# Patient Record
Sex: Female | Born: 1995 | Race: Black or African American | Hispanic: No | Marital: Single | Smoking: Never smoker
Health system: Southern US, Community
[De-identification: ages and names within clinical notes are randomized; demographics above are authoritative.]

---

## 2016-07-21 ENCOUNTER — Emergency Department (HOSPITAL_COMMUNITY)
Admission: EM | Admit: 2016-07-21 | Discharge: 2016-07-21 | Disposition: A | Discharge status: Home / Self Care | Payer: Medicaid Other | Attending: Emergency Medicine | Admitting: Emergency Medicine

## 2016-07-21 ENCOUNTER — Encounter (HOSPITAL_COMMUNITY): Payer: Self-pay | Admitting: Emergency Medicine

## 2016-07-21 ENCOUNTER — Emergency Department (HOSPITAL_COMMUNITY): Payer: Medicaid Other

## 2016-07-21 DIAGNOSIS — K5900 Constipation, unspecified: Secondary | ICD-10-CM | POA: Insufficient documentation

## 2016-07-21 DIAGNOSIS — R109 Unspecified abdominal pain: Secondary | ICD-10-CM

## 2016-07-21 LAB — COMPREHENSIVE METABOLIC PANEL
ALT: 17 U/L (ref 14–54)
AST: 21 U/L (ref 15–41)
Albumin: 4 g/dL (ref 3.5–5.0)
Alkaline Phosphatase: 124 U/L (ref 38–126)
Anion gap: 6 (ref 5–15)
BUN: 7 mg/dL (ref 6–20)
CHLORIDE: 108 mmol/L (ref 101–111)
CO2: 24 mmol/L (ref 22–32)
CREATININE: 0.51 mg/dL (ref 0.44–1.00)
Calcium: 9.2 mg/dL (ref 8.9–10.3)
Glucose, Bld: 88 mg/dL (ref 65–99)
Potassium: 3.7 mmol/L (ref 3.5–5.1)
Sodium: 138 mmol/L (ref 135–145)
Total Bilirubin: 1 mg/dL (ref 0.3–1.2)
Total Protein: 7.5 g/dL (ref 6.5–8.1)

## 2016-07-21 LAB — CBC
HCT: 37.4 % (ref 36.0–46.0)
Hemoglobin: 12.7 g/dL (ref 12.0–15.0)
MCH: 28.2 pg (ref 26.0–34.0)
MCHC: 34 g/dL (ref 30.0–36.0)
MCV: 82.9 fL (ref 78.0–100.0)
PLATELETS: 149 10*3/uL — AB (ref 150–400)
RBC: 4.51 MIL/uL (ref 3.87–5.11)
RDW: 12.9 % (ref 11.5–15.5)
WBC: 6.1 10*3/uL (ref 4.0–10.5)

## 2016-07-21 LAB — URINALYSIS, ROUTINE W REFLEX MICROSCOPIC
Bilirubin Urine: NEGATIVE
GLUCOSE, UA: NEGATIVE mg/dL
Hgb urine dipstick: NEGATIVE
Ketones, ur: NEGATIVE mg/dL
LEUKOCYTES UA: NEGATIVE
NITRITE: NEGATIVE
PROTEIN: NEGATIVE mg/dL
Specific Gravity, Urine: 1.01 (ref 1.005–1.030)
pH: 6 (ref 5.0–8.0)

## 2016-07-21 LAB — HCG, QUANTITATIVE, PREGNANCY

## 2016-07-21 LAB — LIPASE, BLOOD: LIPASE: 27 U/L (ref 11–51)

## 2016-07-21 MED ORDER — POLYETHYLENE GLYCOL 3350 17 G PO PACK
17.0000 g | PACK | Freq: Every day | ORAL | Status: DC
  Administered 2016-07-21: 17 g via ORAL
  Filled 2016-07-21: qty 1

## 2016-07-21 MED ORDER — POLYETHYLENE GLYCOL 3350 17 GM/SCOOP PO POWD: 17.0000 g | Freq: Every day | ORAL | 0 refills | Status: DC

## 2016-07-21 NOTE — ED Notes (Signed)
Bed: WA17 Expected date:  Expected time:  Means of arrival:  Comments: EMS 21 yo female abdominal pain

## 2016-07-21 NOTE — ED Triage Notes (Signed)
Patient arrives by EMS with complaints of abdominal pain since last night. Denies any vomiting, nausea and/or diarrhea. Patient states was at work at [employer] and stated she ate a biscuit at 2100-stated to EMS one of her co-workers gave her a "long white pill". Ambulatory to room. Patient speaks Swahili.

## 2016-07-21 NOTE — ED Provider Notes (Signed)
[facility] Provider Note   CSN: [id number] Arrival date & time: 07/21/16  [id number]     History   Chief Complaint Chief Complaint  Patient presents with  . Abdominal Pain    HPI Tabitha Young is a 21 y.o. female.  The history is provided by the patient. A language interpreter was used.   Patient presents emergency department as generalized abdominal pain that began last night while at work.  Denies nausea vomiting and diarrhea.  Pain began shortly after eating food.  No back pain.  No dysuria or urinary frequency.  No fevers or chills.  Symptoms are mild in severity   History reviewed. No pertinent past medical history.  There are no active problems to display for this patient.   History reviewed. No pertinent surgical history.  OB History    No data available       Home Medications    Prior to Admission medications   Not on File    Family History History reviewed. No pertinent family history.  Social History Social History  Substance Use Topics  . Smoking status: Never Smoker  . Smokeless tobacco: Never Used  . Alcohol use Not on file     Allergies   Patient has no known allergies.   Review of Systems Review of Systems  All other systems reviewed and are negative.    Physical Exam Updated Vital Signs BP 124/88 (BP Location: Right Arm)   Pulse 65   Temp 98.1 F (36.7 C) (Oral)   Resp 18   LMP 07/11/2016   SpO2 100%   Physical Exam  Constitutional: She is oriented to person, place, and time. She appears well-developed and well-nourished. No distress.  HENT:  Head: Normocephalic and atraumatic.  Eyes: EOM are normal.  Neck: Normal range of motion.  Cardiovascular: Normal rate, regular rhythm and normal heart sounds.   Pulmonary/Chest: Effort normal and breath sounds normal.  Abdominal: Soft. She exhibits no distension. There is no tenderness.  Musculoskeletal: Normal range of motion.  Neurological: She is alert and oriented to  person, place, and time.  Skin: Skin is warm and dry.  Psychiatric: She has a normal mood and affect. Judgment normal.  Nursing note and vitals reviewed.    ED Treatments / Results  Labs (all labs ordered are listed, but only abnormal results are displayed) Labs Reviewed  CBC - Abnormal; Notable for the following:       Result Value   Platelets 149 (*)    All other components within normal limits  LIPASE, BLOOD  COMPREHENSIVE METABOLIC PANEL  URINALYSIS, ROUTINE W REFLEX MICROSCOPIC  HCG, QUANTITATIVE, PREGNANCY    EKG  EKG Interpretation None       Radiology Dg Abd 2 Views  Result Date: 07/21/2016 CLINICAL DATA:  21 year old female with a history of abdominal pain EXAM: ABDOMEN - 2 VIEW COMPARISON:  None. FINDINGS: Gas within stomach small bowel and colon.  No abnormal distention. Upright images demonstrate no air-fluid level in the small bowel or colon. Formed stool within the right colon. Gas extends to the rectum. No abnormal calcifications. No unexpected radiopaque foreign body or soft tissue density. Unremarkable musculoskeletal structures. IMPRESSION: Unremarkable bowel gas pattern with moderate stool burden. Electronically Signed   By: Gilmer MorJaime  Wagner D.O.   On: 07/21/2016 07:19    Procedures Procedures (including critical care time)  Medications Ordered in ED Medications - No data to display   Initial Impression / Assessment and Plan / ED Course  I  have reviewed the triage vital signs and the nursing notes.  Pertinent labs & imaging results that were available during my care of the patient were reviewed by me and considered in my medical decision making (see chart for details).     Patient be given MiraLAX in the ER.  Discharge home with MiraLAX.  Likely constipation sig cause of her pain.  Repeat abdominal exam without focal tenderness.  No indication for advanced imaging or admission to the hospital at this time  Final Clinical Impressions(s) / ED Diagnoses     Final diagnoses:  Constipation  Abdominal pain    New Prescriptions New Prescriptions   No medications on file     Azalia Bilis, MD 07/21/16 [phone]

## 2018-01-04 ENCOUNTER — Ambulatory Visit (HOSPITAL_COMMUNITY)
Admission: EM | Admit: 2018-01-04 | Discharge: 2018-01-04 | Disposition: A | Discharge status: Home / Self Care | Payer: Self-pay | Attending: Family Medicine | Admitting: Family Medicine

## 2018-01-04 ENCOUNTER — Encounter (HOSPITAL_COMMUNITY): Payer: Self-pay | Admitting: Emergency Medicine

## 2018-01-04 DIAGNOSIS — M79601 Pain in right arm: Secondary | ICD-10-CM

## 2018-01-04 DIAGNOSIS — M7521 Bicipital tendinitis, right shoulder: Secondary | ICD-10-CM

## 2018-01-04 MED ORDER — PREDNISONE 50 MG PO TABS: ORAL_TABLET | ORAL | 0 refills | Status: DC

## 2018-01-04 NOTE — ED Provider Notes (Signed)
[facility]    CSN: [id number] Arrival date & time: 01/04/18  1143     History   Chief Complaint Chief Complaint  Patient presents with  . Arm Pain    HPI Tabitha Young is a 22 y.o. female.   Initial visit for this patient who speaks Swahili. With swahili interpreter, pt c/o pain in her R arm, the whole arm, from the shoulder to the fingers, x3 weeks, denies injury, pt does heavy lifting.   Patient works in a [employer].  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  She is having pain the length of her right arm including the anterior shoulder.  She does not have any neck pain or restriction of range of motion.  She is able to raise her arm up and down.  She has no wrist pain.     History reviewed. No pertinent past medical history.  There are no active problems to display for this patient.   History reviewed. No pertinent surgical history.  OB History   None      Home Medications    Prior to Admission medications   Medication Sig Start Date End Date Taking? Authorizing Provider  polyethylene glycol powder (MIRALAX) powder Take 17 g by mouth daily. Patient not taking: Reported on 01/04/2018 07/21/16   Azalia Bilis, MD    Family History Family History  Problem Relation Age of Onset  . Healthy Mother   . Healthy Father     Social History Social History   Tobacco Use  . Smoking status: Never Smoker  . Smokeless tobacco: Never Used  Substance Use Topics  . Alcohol use: Never    Frequency: Never  . Drug use: Not on file     Allergies   Patient has no known allergies.   Review of Systems Review of Systems   Physical Exam Triage Vital Signs ED Triage Vitals  Enc Vitals Group     BP 01/04/18 1231 113/65     Pulse Rate 01/04/18 1231 67     Resp 01/04/18 1231 16     Temp 01/04/18 1231 98.2 F (36.8 C)     Temp src --      SpO2 01/04/18 1231 100 %     Weight --      Height --    Head Circumference --      Peak Flow --      Pain Score 01/04/18 1235 7     Pain Loc --      Pain Edu? --      Excl. in GC? --    No data found.  Updated Vital Signs BP 113/65   Pulse 67   Temp 98.2 F (36.8 C)   Resp 16   LMP 12/12/2017   SpO2 100%    Physical Exam  Constitutional: She is oriented to person, place, and time. She appears well-developed and well-nourished.  Eyes: Conjunctivae are normal.  Neck: Normal range of motion. Neck supple.  Pulmonary/Chest: Effort normal.  Musculoskeletal:  Tender right shoulder, anterior joint line  Patient has full range of motion of the right shoulder although it is uncomfortable.  Negative Tinel sign, good range of motion of wrist, fingers, and elbow.  Neurological: She is alert and oriented to person, place, and time.  Skin: Skin is warm and dry.  Nursing note and vitals reviewed.    UC Treatments / Results  Labs (all labs ordered are listed,  but only abnormal results are displayed) Labs Reviewed - No data to display  EKG None  Radiology No results found.  Procedures Procedures (including critical care time)  Medications Ordered in UC Medications - No data to display  Initial Impression / Assessment and Plan / UC Course  I have reviewed the triage vital signs and the nursing notes.  Pertinent labs & imaging results that were available during my care of the patient were reviewed by me and considered in my medical decision making (see chart for details).    Final Clinical Impressions(s) / UC Diagnoses   Final diagnoses:  None   Discharge Instructions   None    ED Prescriptions    None     Controlled Substance Prescriptions [place] Controlled Substance Registry consulted? Not Applicable   Elvina Sidle, MD 01/04/18 [phone]

## 2018-01-04 NOTE — ED Triage Notes (Signed)
With swahili interpreter, pt c/o pain in her R arm, the whole arm, from the shoulder to the fingers, x3 weeks, denies injury, pt does heavy lifting.

## 2018-01-09 ENCOUNTER — Encounter (HOSPITAL_COMMUNITY): Payer: Self-pay | Admitting: Emergency Medicine

## 2018-01-09 ENCOUNTER — Other Ambulatory Visit: Payer: Self-pay

## 2018-01-09 ENCOUNTER — Ambulatory Visit (HOSPITAL_COMMUNITY)
Admission: EM | Admit: 2018-01-09 | Discharge: 2018-01-09 | Disposition: A | Discharge status: Home / Self Care | Payer: Self-pay | Attending: Family Medicine | Admitting: Family Medicine

## 2018-01-09 DIAGNOSIS — M79601 Pain in right arm: Secondary | ICD-10-CM

## 2018-01-09 MED ORDER — NAPROXEN 500 MG PO TABS: 500.0000 mg | ORAL_TABLET | Freq: Two times a day (BID) | ORAL | 0 refills | Status: DC

## 2018-01-09 NOTE — ED Triage Notes (Signed)
Utilizing swahili interpreter.   Patient seen 01/04/18 for the same.   Patient has pain from shoulder to fingertips, fingers feel numb.    Patient is wanting a note for her work place explaining her issue and the need for another job

## 2018-01-09 NOTE — ED Provider Notes (Signed)
[facility]    CSN: [id number] Arrival date & time: 01/09/18  4098     History   Chief Complaint Chief Complaint  Patient presents with  . Arm Pain    HPI Swahili interpretation via [employer] interpreter Raygen Linquist is a 22 y.o. female no significant past medical history presenting today for evaluation of right arm pain.  Patient has had arm pain for the past few weeks.  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  Patient states that this machine vibrates a lot and she has to control the movement.  Because of this she has noticed gradually worsening pain and numbness in her right arm.  Pain is from the shoulder to the fingers.  Her hand and arm began to feel very heavy and she has difficulty opening bottles or cooking and doing daily activities.  She has noticed when she has had a different job within the [employer] and a break from using the machine her pain improves.  Patient would like to have a note requesting a different position.  She denies any sudden onset of her pain.  Denies neck pain.  Denies any specific injury.  It was seen here approximately 5 days ago for similar pain and treated with prednisone.  Patient did not take this medicine.  She has mainly been using ice to help with the discomfort.  HPI  History reviewed. No pertinent past medical history.  There are no active problems to display for this patient.   History reviewed. No pertinent surgical history.  OB History   None      Home Medications    Prior to Admission medications   Medication Sig Start Date End Date Taking? Authorizing Provider  naproxen (NAPROSYN) 500 MG tablet Take 1 tablet (500 mg total) by mouth 2 (two) times daily. 01/09/18   Wieters, Junius Creamer, PA-C    Family History Family History  Problem Relation Age of Onset  . Healthy Mother   . Healthy Father     Social History Social History   Tobacco Use  . Smoking status: Never Smoker  .  Smokeless tobacco: Never Used  Substance Use Topics  . Alcohol use: Never    Frequency: Never  . Drug use: Not on file     Allergies   Patient has no known allergies.   Review of Systems Review of Systems  Constitutional: Negative for fatigue and fever.  Eyes: Negative for visual disturbance.  Respiratory: Negative for shortness of breath.   Cardiovascular: Negative for chest pain.  Gastrointestinal: Negative for abdominal pain, nausea and vomiting.  Musculoskeletal: Positive for arthralgias and myalgias. Negative for joint swelling.  Skin: Negative for color change, rash and wound.  Neurological: Positive for weakness and numbness. Negative for dizziness, light-headedness and headaches.     Physical Exam Triage Vital Signs ED Triage Vitals  Enc Vitals Group     BP 01/09/18 0842 136/82     Pulse Rate 01/09/18 0842 87     Resp 01/09/18 0842 16     Temp 01/09/18 0842 97.7 F (36.5 C)     Temp Source 01/09/18 0842 Oral     SpO2 01/09/18 0842 100 %     Weight --      Height --      Head Circumference --      Peak Flow --      Pain Score 01/09/18 0903 6     Pain Loc --  Pain Edu? --      Excl. in GC? --    No data found.  Updated Vital Signs BP 136/82 (BP Location: Left Arm)   Pulse 87   Temp 97.7 F (36.5 C) (Oral)   Resp 16   LMP 12/12/2017   SpO2 100%   Visual Acuity Right Eye Distance:   Left Eye Distance:   Bilateral Distance:    Right Eye Near:   Left Eye Near:    Bilateral Near:     Physical Exam  Constitutional: She is oriented to person, place, and time. She appears well-developed and well-nourished.  No acute distress  HENT:  Head: Normocephalic and atraumatic.  Nose: Nose normal.  Eyes: Conjunctivae are normal.  Neck: Neck supple.  Cardiovascular: Normal rate.  Pulmonary/Chest: Effort normal. No respiratory distress.  Abdominal: She exhibits no distension.  Musculoskeletal: Normal range of motion.  -Physical exam difficult with  interpretation of instructions via translator Nontender palpation over right clavicle, tenderness towards AC joint and anterior shoulder extending into biceps, mild tenderness over lateral trapezius musculature, nontender over scapular spine.  Tenderness through all palpation of the arm into elbow as well as the wrist. Grip strength poor on right side compared to left, 3/5, decreased active range of motion with right shoulder and right elbow Negative Tinel's, negative Finkelstein Radial pulse 2+  Neurological: She is alert and oriented to person, place, and time.  Skin: Skin is warm and dry.  Psychiatric: She has a normal mood and affect.  Nursing note and vitals reviewed.    UC Treatments / Results  Labs (all labs ordered are listed, but only abnormal results are displayed) Labs Reviewed - No data to display  EKG None  Radiology No results found.  Procedures Procedures (including critical care time)  Medications Ordered in UC Medications - No data to display  Initial Impression / Assessment and Plan / UC Course  I have reviewed the triage vital signs and the nursing notes.  Pertinent labs & imaging results that were available during my care of the patient were reviewed by me and considered in my medical decision making (see chart for details).     Patient with pain, does appear to have weakness and decreased sensation throughout right hand.  Recommended patient rest, anti-inflammatories.  According to patient, symptoms seem to be correlated to use of specific machine.  Most likely overuse injury/constant vibration irritating nerves.  Recommended different position with that job avoiding use of this machine.  Advised patient to follow-up if strength and numbness not improving with changing her job.Discussed strict return precautions. Patient verbalized understanding and is agreeable with plan.  Final Clinical Impressions(s) / UC Diagnoses   Final diagnoses:  Right arm pain      Discharge Instructions     Please take Naprosyn twice daily  Please continue to ice shoulder Rest arm  Follow up if not improving, strength not returning with rest, or numbness not improving with changing jobs   ED Prescriptions    Medication Sig Dispense Auth. Provider   naproxen (NAPROSYN) 500 MG tablet Take 1 tablet (500 mg total) by mouth 2 (two) times daily. 30 tablet Wieters, FateHallie C, PA-C     Controlled Substance Prescriptions [place] Controlled Substance Registry consulted? Not Applicable   Lew DawesWieters, Hallie C, [place]PA-C 01/09/18 [phone]

## 2018-01-09 NOTE — Discharge Instructions (Signed)
Please take Naprosyn twice daily  Please continue to ice shoulder Rest arm  Follow up if not improving, strength not returning with rest, or numbness not improving with changing jobs

## 2018-01-12 ENCOUNTER — Ambulatory Visit (HOSPITAL_COMMUNITY)
Admission: EM | Admit: 2018-01-12 | Discharge: 2018-01-12 | Disposition: A | Discharge status: Home / Self Care | Payer: Commercial Managed Care - PPO | Attending: Family Medicine | Admitting: Family Medicine

## 2018-01-12 NOTE — ED Notes (Signed)
Pt was directed to occupational health to have paperwork completed for work.

## 2018-02-25 IMAGING — CR DG ABDOMEN 2V
2 series · 2 of 2 positions shown · non-contrast
Comparison: None.

CLINICAL DATA: 21-year-old female with a history of abdominal pain

EXAM:
ABDOMEN - 2 VIEW

[w abdomen upright]
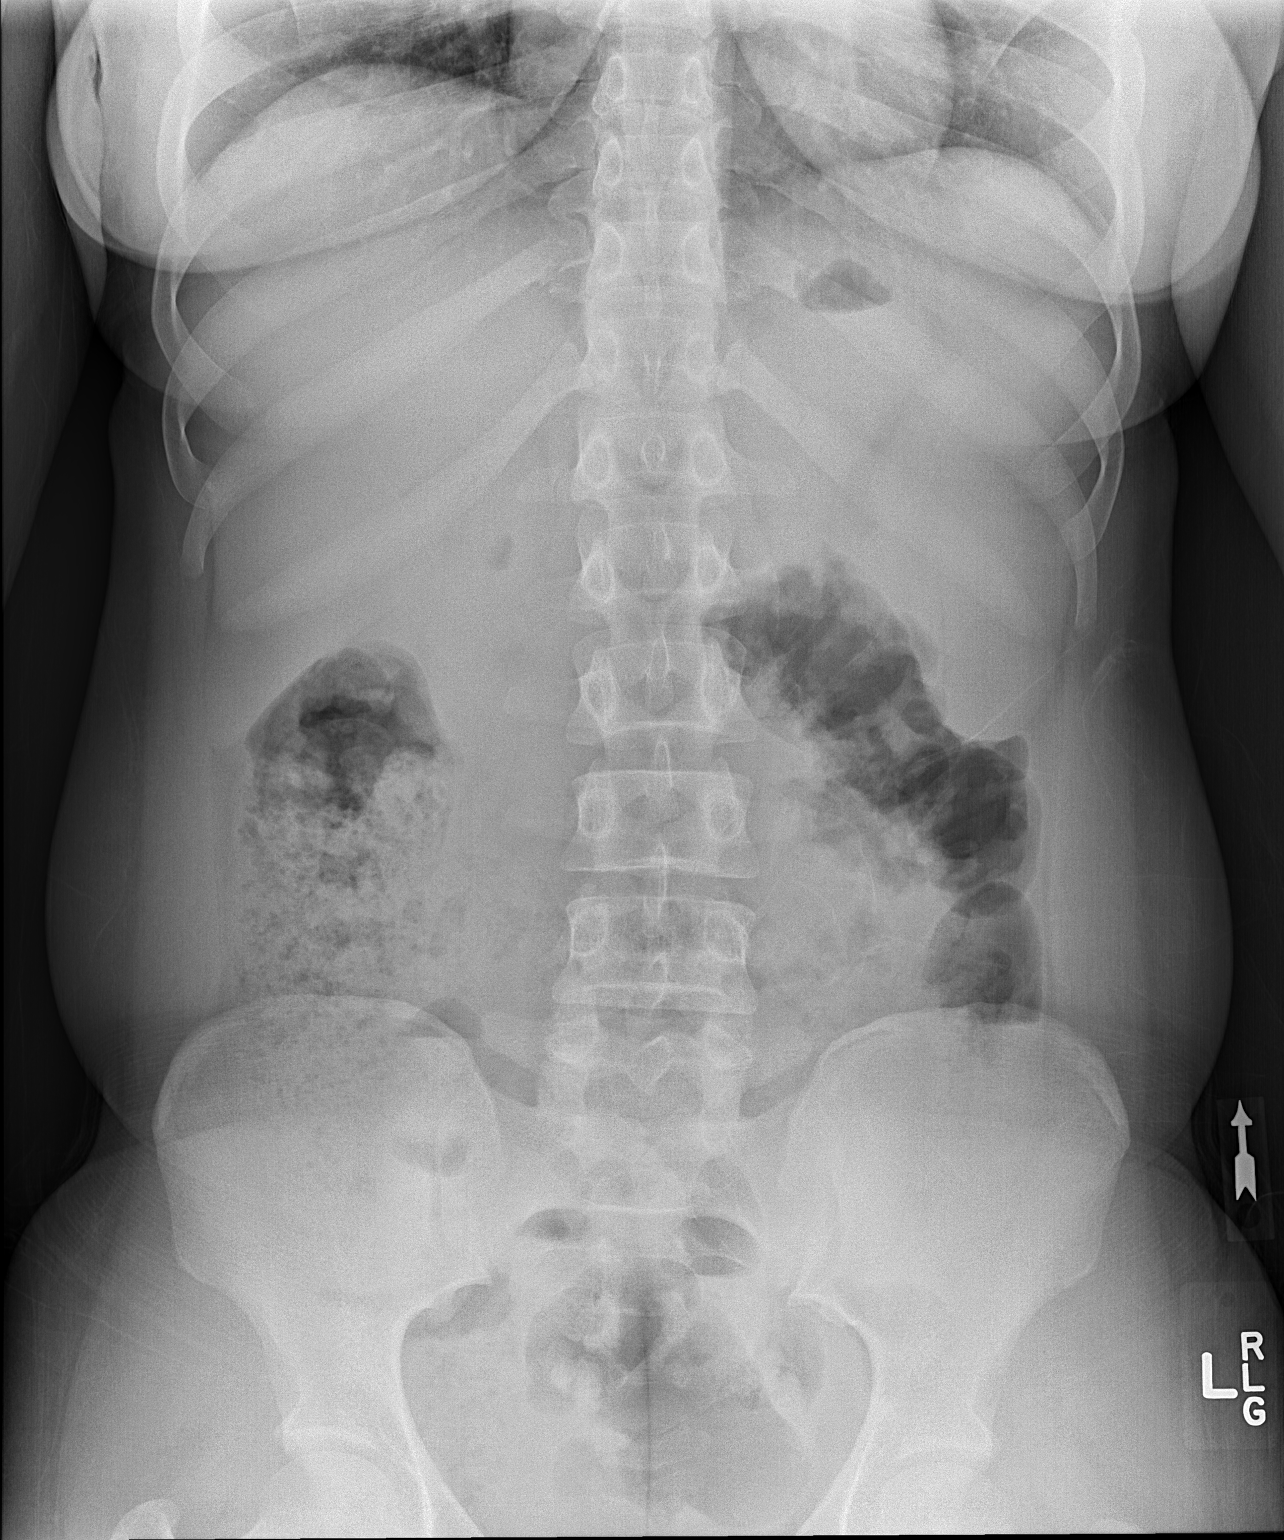

[t abdomen supine]
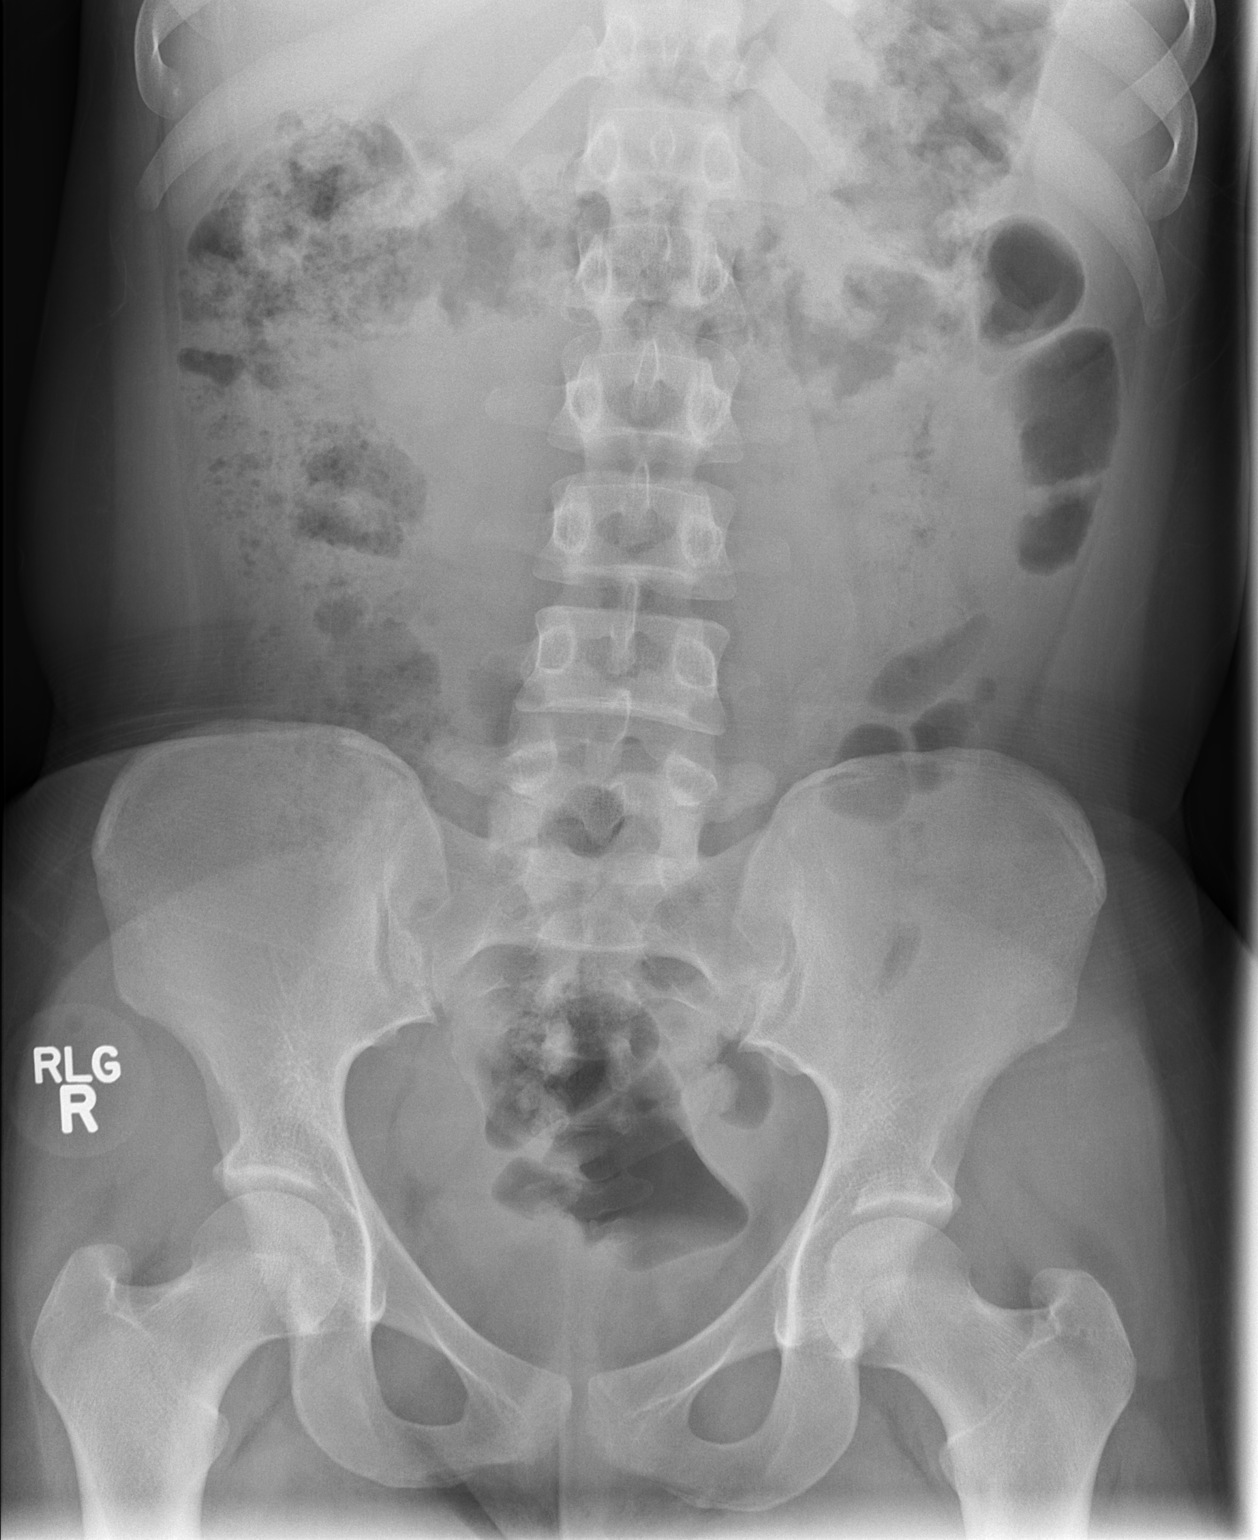

[2 of 2 positions shown; findings below may reference images not displayed]

FINDINGS: Gas within stomach small bowel and colon.  No abnormal distention.

Upright images demonstrate no air-fluid level in the small bowel or
colon. Formed stool within the right colon. Gas extends to the
rectum.

No abnormal calcifications. No unexpected radiopaque foreign body or
soft tissue density.

Unremarkable musculoskeletal structures.
IMPRESSION: Unremarkable bowel gas pattern with moderate stool burden.

## 2022-08-02 ENCOUNTER — Ambulatory Visit (HOSPITAL_COMMUNITY)
Admission: EM | Admit: 2022-08-02 | Discharge: 2022-08-02 | Disposition: A | Discharge status: Home / Self Care | Payer: Self-pay | Attending: Urgent Care | Admitting: Urgent Care

## 2022-08-02 ENCOUNTER — Other Ambulatory Visit: Payer: Self-pay

## 2022-08-02 ENCOUNTER — Encounter (HOSPITAL_COMMUNITY): Payer: Self-pay | Admitting: Emergency Medicine

## 2022-08-02 DIAGNOSIS — M25551 Pain in right hip: Secondary | ICD-10-CM

## 2022-08-02 DIAGNOSIS — W19XXXA Unspecified fall, initial encounter: Secondary | ICD-10-CM

## 2022-08-02 DIAGNOSIS — R42 Dizziness and giddiness: Secondary | ICD-10-CM

## 2022-08-02 MED ORDER — NAPROXEN 500 MG PO TABS: 500.0000 mg | ORAL_TABLET | Freq: Two times a day (BID) | ORAL | 0 refills | Status: AC

## 2022-08-02 MED ORDER — NAPROXEN 500 MG PO TABS: 500.0000 mg | ORAL_TABLET | Freq: Two times a day (BID) | ORAL | 0 refills | Status: DC

## 2022-08-02 NOTE — ED Provider Notes (Signed)
[facility]    CSN: [id number] Arrival date & time: 08/02/22  1639      History   Chief Complaint Chief Complaint  Patient presents with   Hip Pain    HPI Tabitha Young is a 27 y.o. female.   Pleasant 27 year old female presents today due to concerns of a fall that occurred at work mid afternoon.  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  While doing this, she developed the sensation of dizziness, and fell over.  She denies trauma to her head, no LOC.  She did hit her right upper thigh on the floor.  She is here today primarily needing a note to return back to work.  She states that was the only episode of dizziness, and it has not recurred.  She is not dizzy now.  She is fully ambulatory and denies significant discomfort to her hip, only with direct pressure on the affected area.  She denies any swelling or bruising to the hip or thigh.  She had eaten lunch just prior to the dizzy episode, she had only drank a small bottle of water all day.  She has no chronic medical conditions and takes no prescription medications.  She denies any headache, blurred vision, weakness, gait disturbance, or any additional neurological symptoms.  A language interpreter was used Programmer, applications).  Hip Pain    History reviewed. No pertinent past medical history.  There are no problems to display for this patient.   History reviewed. No pertinent surgical history.  OB History   No obstetric history on file.      Home Medications    Prior to Admission medications   Medication Sig Start Date End Date Taking? Authorizing Provider  naproxen (NAPROSYN) 500 MG tablet Take 1 tablet (500 mg total) by mouth 2 (two) times daily with a meal for 5 days. 08/02/22 08/07/22  Maretta Bees, PA    Family History Family History  Problem Relation Age of Onset   Healthy Mother    Healthy Father     Social History Social History    Tobacco Use   Smoking status: Never   Smokeless tobacco: Never  Vaping Use   Vaping Use: Never used  Substance Use Topics   Alcohol use: Never   Drug use: Never     Allergies   Patient has no known allergies.   Review of Systems Review of Systems As per HPI  Physical Exam Triage Vital Signs ED Triage Vitals  Enc Vitals Group     BP 08/02/22 1740 126/81     Pulse Rate 08/02/22 1740 80     Resp 08/02/22 1740 16     Temp 08/02/22 1740 98 F (36.7 C)     Temp Source 08/02/22 1740 Oral     SpO2 08/02/22 1740 99 %     Weight --      Height --      Head Circumference --      Peak Flow --      Pain Score 08/02/22 1734 4     Pain Loc --      Pain Edu? --      Excl. in GC? --    No data found.  Updated Vital Signs BP 126/81 (BP Location: Right Arm)   Pulse 80   Temp 98 F (36.7 C) (Oral)   Resp 16   LMP 07/14/2022   SpO2 99%  Visual Acuity Right Eye Distance:   Left Eye Distance:   Bilateral Distance:    Right Eye Near:   Left Eye Near:    Bilateral Near:     Physical Exam Vitals and nursing note reviewed.  Constitutional:      General: She is not in acute distress.    Appearance: Normal appearance. She is normal weight. She is not ill-appearing, toxic-appearing or diaphoretic.     Comments: Pt in no distress, very animated. Smiling upon exam. Able to sit, stand and maneuver without difficulty.  HENT:     Head: Normocephalic and atraumatic.     Right Ear: Tympanic membrane, ear canal and external ear normal. There is no impacted cerumen.     Left Ear: Tympanic membrane, ear canal and external ear normal. There is no impacted cerumen.     Nose: Nose normal. No congestion or rhinorrhea.     Mouth/Throat:     Mouth: Mucous membranes are moist.     Pharynx: Oropharynx is clear. No oropharyngeal exudate or posterior oropharyngeal erythema.  Eyes:     General: No visual field deficit or scleral icterus.       Right eye: No discharge.        Left eye:  No discharge.     Extraocular Movements: Extraocular movements intact.     Conjunctiva/sclera: Conjunctivae normal.     Pupils: Pupils are equal, round, and reactive to light.  Cardiovascular:     Rate and Rhythm: Normal rate and regular rhythm.     Pulses: Normal pulses.     Heart sounds: Normal heart sounds. No murmur heard.    No friction rub. No gallop.  Pulmonary:     Effort: Pulmonary effort is normal. No respiratory distress.     Breath sounds: Normal breath sounds. No stridor. No wheezing, rhonchi or rales.  Chest:     Chest wall: No tenderness.  Musculoskeletal:        General: Tenderness (minimal tenderness to R upper thigh; no swelling or bruising) present. No swelling, deformity or signs of injury. Normal range of motion.     Cervical back: Normal range of motion and neck supple. No rigidity or tenderness.     Right lower leg: No edema.     Left lower leg: No edema.       Legs:  Lymphadenopathy:     Cervical: No cervical adenopathy.  Skin:    General: Skin is warm and dry.     Capillary Refill: Capillary refill takes less than 2 seconds.     Coloration: Skin is not jaundiced.     Findings: No bruising, erythema or rash.  Neurological:     General: No focal deficit present.     Mental Status: She is alert and oriented to person, place, and time.     Cranial Nerves: Cranial nerves 2-12 are intact. No cranial nerve deficit, dysarthria or facial asymmetry.     Sensory: Sensation is intact. No sensory deficit.     Motor: Motor function is intact. No weakness, tremor, atrophy, abnormal muscle tone, seizure activity or pronator drift.     Coordination: Coordination is intact. Romberg sign negative. Coordination normal. Finger-Nose-Finger Test and Heel to [facility] Test normal. Rapid alternating movements normal.     Gait: Gait is intact. Gait and tandem walk normal.     Deep Tendon Reflexes: Reflexes are normal and symmetric. Reflexes normal.      UC Treatments / Results   Labs (all labs  ordered are listed, but only abnormal results are displayed) Labs Reviewed - No data to display  EKG   Radiology No results found.  Procedures Procedures (including critical care time)  Medications Ordered in UC Medications - No data to display  Initial Impression / Assessment and Plan / UC Course  I have reviewed the triage vital signs and the nursing notes.  Pertinent labs & imaging results that were available during my care of the patient were reviewed by me and considered in my medical decision making (see chart for details).     R hip / upper thigh pain -the location of patient's pain is in the musculature.  There is no bony tenderness.  She has full range of motion and is fully ambulatory.  This is likely a small contusion.  Will have her ice and take naproxen on an as-needed basis. Fall -neurological exam within normal limits.  This was likely exertional due to dehydration.  We did discuss increasing water intake. Dizziness -a single episode while at work today.  No dizziness since.  We discussed possible workup to include orthostatics, blood work, glucose fingerstick, and UA.  Patient denied and states she feels at her baseline health.  Is requesting a work note to return tomorrow.  Discussed with patient that should it occur again, to return for the above workup.   Final Clinical Impressions(s) / UC Diagnoses   Final diagnoses:  Right hip pain  Fall, initial encounter  Dizziness     Discharge Instructions      Your exam is normal. I suspect your dizziness is due to not drinking enough water. Please make sure to drink 8 bottles of water daily. Eat on a routine schedule.  Take the medication called in today twice daily with food, as needed for hip pain. Ice the area of your leg.  You may return to work tomorrow. If you have another episode of dizziness, return for a more comprehensive workup.     ED Prescriptions     Medication Sig  Dispense Auth. Provider   naproxen (NAPROSYN) 500 MG tablet  (Status: Discontinued) Take 1 tablet (500 mg total) by mouth 2 (two) times daily with a meal for 5 days. 10 tablet Chester Romero L, PA   naproxen (NAPROSYN) 500 MG tablet Take 1 tablet (500 mg total) by mouth 2 (two) times daily with a meal for 5 days. 10 tablet Yvan Dority L, [place]      PDMP not reviewed this encounter.   Maretta Bees, [place] 08/02/22 [phone]

## 2022-08-02 NOTE — ED Triage Notes (Signed)
Reports falling onto floor.  Reports this was today.  Patient states moving ?bed covers? Felt dizzy and fell down.  Slight head pain and right hip pain.  Patient has used ice on painful areas, no other treatments.  Patient is very expressive.  Patient reports her supervisor is aware of injury

## 2022-08-02 NOTE — Discharge Instructions (Addendum)
Your exam is normal. I suspect your dizziness is due to not drinking enough water. Please make sure to drink 8 bottles of water daily. Eat on a routine schedule.  Take the medication called in today twice daily with food, as needed for hip pain. Ice the area of your leg.  You may return to work tomorrow. If you have another episode of dizziness, return for a more comprehensive workup.

## 2023-02-21 ENCOUNTER — Emergency Department (HOSPITAL_COMMUNITY)
Admission: EM | Admit: 2023-02-21 | Discharge: 2023-02-21 | Disposition: A | Discharge status: Home / Self Care | Payer: Commercial Managed Care - PPO | Attending: Emergency Medicine | Admitting: Emergency Medicine

## 2023-02-21 ENCOUNTER — Emergency Department (HOSPITAL_COMMUNITY): Payer: Commercial Managed Care - PPO

## 2023-02-21 ENCOUNTER — Encounter (HOSPITAL_COMMUNITY): Payer: Self-pay

## 2023-02-21 ENCOUNTER — Other Ambulatory Visit: Payer: Self-pay

## 2023-02-21 DIAGNOSIS — R1031 Right lower quadrant pain: Secondary | ICD-10-CM | POA: Diagnosis not present

## 2023-02-21 DIAGNOSIS — R109 Unspecified abdominal pain: Secondary | ICD-10-CM | POA: Diagnosis present

## 2023-02-21 LAB — COMPREHENSIVE METABOLIC PANEL
ALT: 12 U/L (ref 0–44)
AST: 18 U/L (ref 15–41)
Albumin: 3.4 g/dL — ABNORMAL LOW (ref 3.5–5.0)
Alkaline Phosphatase: 66 U/L (ref 38–126)
Anion gap: 9 (ref 5–15)
BUN: 10 mg/dL (ref 6–20)
CO2: 21 mmol/L — ABNORMAL LOW (ref 22–32)
Calcium: 9.3 mg/dL (ref 8.9–10.3)
Chloride: 108 mmol/L (ref 98–111)
Creatinine, Ser: 0.77 mg/dL (ref 0.44–1.00)
GFR, Estimated: >60 mL/min (ref >60)
Glucose, Bld: 86 mg/dL (ref 70–99)
Potassium: 3.8 mmol/L (ref 3.5–5.1)
Sodium: 138 mmol/L (ref 135–145)
Total Bilirubin: 0.6 mg/dL (ref <1.2)
Total Protein: 6.5 g/dL (ref 6.5–8.1)

## 2023-02-21 LAB — URINALYSIS, W/ REFLEX TO CULTURE (INFECTION SUSPECTED)
Bilirubin Urine: NEGATIVE
Glucose, UA: NEGATIVE mg/dL
Hgb urine dipstick: NEGATIVE
Ketones, ur: NEGATIVE mg/dL
Nitrite: NEGATIVE
Protein, ur: NEGATIVE mg/dL
Specific Gravity, Urine: >1.046 — ABNORMAL HIGH (ref 1.005–1.030)
pH: 5 (ref 5.0–8.0)

## 2023-02-21 LAB — CBC WITH DIFFERENTIAL/PLATELET
Abs Immature Granulocytes: 0.01 10*3/uL (ref 0.00–0.07)
Basophils Absolute: 0 10*3/uL (ref 0.0–0.1)
Basophils Relative: 0 %
Eosinophils Absolute: 0.5 10*3/uL (ref 0.0–0.5)
Eosinophils Relative: 9 %
HCT: 34.7 % — ABNORMAL LOW (ref 36.0–46.0)
Hemoglobin: 11.6 g/dL — ABNORMAL LOW (ref 12.0–15.0)
Immature Granulocytes: 0 %
Lymphocytes Relative: 36 %
Lymphs Abs: 2 10*3/uL (ref 0.7–4.0)
MCH: 28.3 pg (ref 26.0–34.0)
MCHC: 33.4 g/dL (ref 30.0–36.0)
MCV: 84.6 fL (ref 80.0–100.0)
Monocytes Absolute: 0.4 10*3/uL (ref 0.1–1.0)
Monocytes Relative: 8 %
Neutro Abs: 2.7 10*3/uL (ref 1.7–7.7)
Neutrophils Relative %: 47 %
Platelets: 167 10*3/uL (ref 150–400)
RBC: 4.1 MIL/uL (ref 3.87–5.11)
RDW: 13.2 % (ref 11.5–15.5)
WBC: 5.6 10*3/uL (ref 4.0–10.5)
nRBC: 0 % (ref 0.0–0.2)

## 2023-02-21 LAB — LIPASE, BLOOD: Lipase: 39 U/L (ref 11–51)

## 2023-02-21 LAB — HCG, QUANTITATIVE, PREGNANCY: hCG, Beta Chain, Quant, S: <1 m[IU]/mL (ref <5)

## 2023-02-21 MED ORDER — ACETAMINOPHEN 500 MG PO TABS
1000.0000 mg | ORAL_TABLET | Freq: Once | ORAL | Status: AC
  Administered 2023-02-21: 1000 mg via ORAL
  Filled 2023-02-21: qty 2

## 2023-02-21 MED ORDER — KETOROLAC TROMETHAMINE 15 MG/ML IJ SOLN
15.0000 mg | Freq: Once | INTRAMUSCULAR | Status: AC
  Administered 2023-02-21: 15 mg via INTRAVENOUS
  Filled 2023-02-21: qty 1

## 2023-02-21 MED ORDER — IOHEXOL 350 MG/ML SOLN
75.0000 mL | Freq: Once | INTRAVENOUS | Status: AC | PRN
  Administered 2023-02-21: 75 mL via INTRAVENOUS

## 2023-02-21 NOTE — Discharge Instructions (Addendum)
Thank you for coming to [facility] Emergency Department. You were seen for pain in the right inguinal region. We did an exam, labs, and imaging, and these showed no acute findings. You can alternate taking Tylenol and ibuprofen as needed for pain. You can take 650mg  tylenol (acetaminophen) every 4-6 hours, and 600 mg ibuprofen 3 times a day. Please follow up with your primary care provider within 1 week.   Do not hesitate to return to the ED or call 911 if you experience: -Worsening symptoms -Inability to walk -Urinary or vaginal symptoms -Lightheadedness, passing out -Fevers/chills -Anything else that concerns you   Asante kwa Bear[place] kwa Idara ya Dharura ya [place]. Ulionekana kwa maumivu katika eneo la inguinal la [place]. Tulifanya mtihani, maabara, na taswira, na haya hayakuonyesha matokeo ya papo hapo. [place] Curet kubadilisha kuchukua Tylenol na ibuprofen kama inahitajika kwa maumivu. Linward Natal 650mg  tylenol (acetaminophen) kila masaa 4-6, na 600 mg ibuprofen mara 3 kwa siku.  Tafadhali fuatana na mtoa huduma wako wa msingi ndani ya wiki 1.   Usisite kurudi kwa ED au piga simu 911 ikiwa utapata uzoefu: -Kuzidisha dalili -Aretta Nip -Dalili za mkojo au uke -Trey Paula, Ammie Ferrier -Homa/baridi -Kitu kingine chochote kinachokuhusu

## 2023-02-21 NOTE — ED Triage Notes (Signed)
Patient reports three days of RLQ pain without n/v/d. Also endorses burning with urination.

## 2023-02-21 NOTE — ED Provider Notes (Incomplete)
[place] EMERGENCY DEPARTMENT AT [facility] Provider Note   CSN: [id number] Arrival date & time: 02/21/23  1542     History {Add pertinent medical, surgical, social history, OB history to HPI:1} Chief Complaint  Patient presents with   Abdominal Pain    Tabitha Young is a 27 y.o. female with PMH as listed below who presents with pain between the thigh and lower abdomen on right leg, 3 days, no h/o similar. Worse when lying on the right side, feels like "burning inside." Denies f/c, n/v/d, pregnancy, dysuria/hematuria, vaginal symptoms, history of abdominal surgery. No trauma or incident that incited the pain. Pain is constant. Hasn't tried anything for the pain yet because she didn't know what was causing it.    History obtained utilizing a Swahili video interpreter.   History reviewed. No pertinent past medical history.     Home Medications Prior to Admission medications   Not on File      Allergies    Patient has no known allergies.    Review of Systems   Review of Systems A 10 point review of systems was performed and is negative unless otherwise reported in HPI.  Physical Exam Updated Vital Signs BP 120/88   Pulse 63   Temp 99.6 F (37.6 C) (Oral)   Resp 18   SpO2 100%  Physical Exam General: Normal appearing female, lying in bed.  HEENT: Sclera anicteric, MMM, trachea midline.  Cardiology: RRR, no murmurs/rubs/gallops.  Resp: Normal respiratory rate and effort. CTAB, no wheezes, rhonchi, crackles.  Abd: Soft, non-tender, non-distended. No rebound tenderness or guarding. TTP in low RLQ and R inguinal region. No hernias or abnormalities to the skin/swelling noted.  GU: Deferred. MSK: No peripheral edema or signs of trauma. Extremities without deformity or TTP. No cyanosis or clubbing. Does have pain in the inguinal region with flexion/extension of the R hip. DP/PT pulses equal bilaterally.  Skin: warm, dry.  Neuro: A&Ox4, CNs II-XII grossly  intact. MAEs. Sensation grossly intact.  Psych: Normal mood and affect.   ED Results / Procedures / Treatments   Labs (all labs ordered are listed, but only abnormal results are displayed) Labs Reviewed  CBC WITH DIFFERENTIAL/PLATELET - Abnormal; Notable for the following components:      Result Value   Hemoglobin 11.6 (*)    HCT 34.7 (*)    All other components within normal limits  COMPREHENSIVE METABOLIC PANEL - Abnormal; Notable for the following components:   CO2 21 (*)    Albumin 3.4 (*)    All other components within normal limits  URINALYSIS, W/ REFLEX TO CULTURE (INFECTION SUSPECTED) - Abnormal; Notable for the following components:   Specific Gravity, Urine >1.046 (*)    Leukocytes,Ua SMALL (*)    Bacteria, UA RARE (*)    All other components within normal limits  HCG, QUANTITATIVE, PREGNANCY  LIPASE, BLOOD    EKG None  Radiology CT ABDOMEN PELVIS W CONTRAST Result Date: 02/21/2023 CLINICAL DATA:  Right lower quadrant abdominal pain. EXAM: CT ABDOMEN AND PELVIS WITH CONTRAST TECHNIQUE: Multidetector CT imaging of the abdomen and pelvis was performed using the standard protocol following bolus administration of intravenous contrast. RADIATION DOSE REDUCTION: This exam was performed according to the departmental dose-optimization program which includes automated exposure control, adjustment of the mA and/or kV according to patient size and/or use of iterative reconstruction technique. CONTRAST:  75mL OMNIPAQUE IOHEXOL 350 MG/ML SOLN COMPARISON:  None Available. FINDINGS: Lower chest: The lung bases are clear. No pleural  effusion. The heart is normal in size. No pericardial effusion. Hepatobiliary: The liver is normal in size. Non-cirrhotic configuration. There is an exophytic hyperattenuating approximately 1.8 x 1.8 x 1.0 cm (anteroposterior x transverse x craniocaudal) lesion arising from the left hepatic lobe, segment 2. This is incompletely characterized on the current  examination. This may represent flash filling hemangioma however, correlation with prior imaging, if available or confirmation with contrast-enhanced MRI abdomen as per liver mass protocol is recommended. No intrahepatic or extrahepatic bile duct dilation. No calcified gallstones. Normal gallbladder wall thickness. No pericholecystic inflammatory changes. Pancreas: Unremarkable. No pancreatic ductal dilatation or surrounding inflammatory changes. Spleen: Within normal limits. No focal lesion. Adrenals/Urinary Tract: Adrenal glands are unremarkable. No suspicious renal mass. No hydronephrosis. No renal or ureteric calculi. Unremarkable urinary bladder. Stomach/Bowel: No disproportionate dilation of the small or large bowel loops. No evidence of abnormal bowel wall thickening or inflammatory changes. The appendix is unremarkable. Vascular/Lymphatic: No ascites or pneumoperitoneum. No abdominal or pelvic lymphadenopathy, by size criteria. No aneurysmal dilation of the major abdominal arteries. Reproductive: Slightly bulky anteverted uterus, not well evaluated on the CT scan exam. No large adnexal mass seen. There is a left-sided corpus luteal cyst. Other: The visualized soft tissues and abdominal wall are unremarkable. Musculoskeletal: No suspicious osseous lesions. 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 IMPRESSION: 1. No acute inflammatory process identified within the abdomen or pelvis. Unremarkable appendix. 2. There is an exophytic hyperattenuating 1.8 cm structure arising from the left hepatic lobe, with imaging characteristics, probable diagnosis and follow-up recommendations, as described above. 3. Multiple other nonacute observations, as described above. Electronically Signed   By: Jules Schick M.D.   On: 02/21/2023 18:14    Procedures Procedures  {Document cardiac monitor, telemetry assessment procedure when appropriate:1}  Medications Ordered in ED Medications  iohexol  (OMNIPAQUE) 350 MG/ML injection 75 mL (75 mLs Intravenous Contrast Given 02/21/23 1734)  acetaminophen (TYLENOL) tablet 1,000 mg (1,000 mg Oral Given 02/21/23 1940)  ketorolac (TORADOL) 15 MG/ML injection 15 mg (15 mg Intravenous Given 02/21/23 1940)    ED Course/ Medical Decision Making/ A&P                          Medical Decision Making Amount and/or Complexity of Data Reviewed Labs: ordered. Decision-making details documented in ED Course. Radiology: ordered. Decision-making details documented in ED Course.  Risk OTC drugs. Prescription drug management.    This patient presents to the ED for concern of right inguinal pain, this involves an extensive number of treatment options, and is a complaint that carries with it a high risk of complications and morbidity.  I considered the following differential and admission for this acute, potentially life threatening condition.   MDM:    For DDX for abdominal pain includes but is not limited to:  Abdominal exam without peritoneal signs. No evidence of acute abdomen at this time.   Low suspicion for acute hepatobiliary disease (including acute cholecystitis or cholangitis), acute pancreatitis (neg lipase), PUD (including gastric perforation), acute infectious processes (pneumonia, hepatitis, pyelonephritis), acute appendicitis, vascular catastrophe, bowel obstruction, viscus perforation, or ovarian torsion, diverticulitis. Pregnancy negative, no c/f ectopic pregnancy. Pain is constant and there are no ovarian cysts on the right side on CT to indicate ovarian torsion. No vaginal sxs or leukocytosis to indicate PID/TOA.   Consider inguinal pain d/t hernia or muscular strain/sprain. No hernia noted on exam but will ensure with CT scan. Does have pain with movement  of R hip, could be muscular in nature. No reported f/c to indicate septic joint.   Clinical Course as of 02/21/23 2117  Tue Feb 21, 2023  1646 WBC: 5.6 No leukocytosis  [HN]   1646 Hemoglobin(!): 11.6 Mildly decreased from 12.7 six years ago [HN]  1728 Lipase: 39 neg [HN]  1728 HCG, Beta Chain, Quant, S: <1 neg [HN]  1728 Comprehensive metabolic panel(!) Unremarkable in the context of this patient's presentation  [HN]  1908 CT ABDOMEN PELVIS W CONTRAST 1. No acute inflammatory process identified within the abdomen or pelvis. Unremarkable appendix. 2. There is an exophytic hyperattenuating 1.8 cm structure arising from the left hepatic lobe, with imaging characteristics, probable diagnosis and follow-up recommendations, as described above. 3. Multiple other nonacute observations, as described above.   [HN]  1947 Urinalysis, w/ Reflex to Culture (Infection Suspected) -Urine, Clean Catch(!) No UTI [HN]    Clinical Course User Index [HN] Loetta Rough, MD    Labs: I Ordered, and personally interpreted labs.  The pertinent results include:  those listed above  Imaging Studies ordered: I ordered imaging studies including CT abd/pelvis I independently visualized and interpreted imaging. I agree with the radiologist interpretation  Additional history obtained from chart review.   Cardiac Monitoring: The patient was maintained on a cardiac monitor.  I personally viewed and interpreted the cardiac monitored which showed an underlying rhythm of: NSR  Reevaluation: After the interventions noted above, I reevaluated the patient and found that they have :improved  Social Determinants of Health: Lives independently  Disposition:  *** DC w/ discharge instructions/return precautions. All questions answered to patient's satisfaction.    Co morbidities that complicate the patient evaluation History reviewed. No pertinent past medical history.   Medicines Meds ordered this encounter  Medications   iohexol (OMNIPAQUE) 350 MG/ML injection 75 mL   acetaminophen (TYLENOL) tablet 1,000 mg   ketorolac (TORADOL) 15 MG/ML injection 15 mg    I have reviewed  the patients home medicines and have made adjustments as needed  Problem List / ED Course: Problem List Items Addressed This Visit   None Visit Diagnoses       Right inguinal pain    -  Primary            {Document critical care time when appropriate:1} {Document review of labs and clinical decision tools ie heart score, Chads2Vasc2 etc:1}  {Document your independent review of radiology images, and any outside records:1} {Document your discussion with family members, caretakers, and with consultants:1} {Document social determinants of health affecting pt's care:1} {Document your decision making why or why not admission, treatments were needed:1}  This note was created using dictation software, which may contain spelling or grammatical errors.
